# Patient Record
Sex: Male | Born: 1985 | Race: White | Hispanic: Yes | Marital: Single | Smoking: Never smoker
Health system: Southern US, Community
[De-identification: ages and names within clinical notes are randomized; demographics above are authoritative.]

## PROBLEM LIST (undated history)

## (undated) HISTORY — PX: BACK SURGERY: SHX140

---

## 2021-01-26 NOTE — Progress Notes (Deleted)
Patient WAS wearing a surgical mask  Contact precautions were followed when caring for the patient.   PPE used by provider during encounter: Surgical mask     Total time I spent in care of this patient today (excluding time spent on other billable services) was *** minutes.     NEW PATIENT VISIT       [name] is a y.o. 56yr male who presents to me for the following problems He is here for a possible male factor in a infertile marriage.  He is currently married to a *** year old male.  The couple  have been trying to achieve a pregnancy for the past *** years.      *** Neither partner has been involved in any prior pregnancies in the past.    The couple has tried ***    [name]  denies a history of testicular exposure to chemicals, radiation, or toxins.  He also denies a history of high fever, epididymitis, orchitis, prostatitis, sexually transmitted diseases, or trauma to the testicles.   He denies a history of a varicocele, testicular torsion, or cryptorchidism, and has no history of postpubertile mumps or a family history of infertility.     [name]  Does *** regular menstrual cycles.  She has no significant past medical history and does not take any medications. [name]  has been seen by Dr. Marland Kitchen , who has performed evaluation and concluded ***.      I, Kyle Ramos, personally reviewed the following labs  and interpreted results to patient  as they relate  to his problems/complaints addressed today.         PMH: ***    PSH:  ***    Meds;***    Family history reviewed:  None relevant to this visit    ***   SH:***       Physical Exam:    GENERAL: Well developed, well nourished, in no acute distress  CHEST: Normal breathing with equal chest rise.    ABDOMEN: Abdomen soft and non-tender without masses ***  BACK: No CVAT  GENITALIA: Testis descended bilaterally, testicular size *** cc bilaterally,   no varicocele palpable, vas deferens and epididymis palpable bilaterally,  Phallus: ***    EXTREMITIES: No clubbing, cyanosis, edema, or deformity    SKIN: Genital and lower body skin intact without lesions or rashes  NEURO: No focal deficits  PSYCH: Patient alert and oriented x3. 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 We will obtain a hormone panel including testosterone, FSH, LH and estradiol to determine the possible cause of male infertility. I have recommended the wife to follow-up with her GYN or reproductive endocrinologist as infertility is a couple's issue and both partners need treatment simultaneously.

## 2021-01-27 ENCOUNTER — Encounter: Payer: Self-pay | Admitting: Urology

## 2021-01-27 ENCOUNTER — Ambulatory Visit: Payer: Commercial Managed Care - PPO | Attending: Urology | Admitting: Urology

## 2021-01-27 VITALS — BP 117/75 | HR 92 | Temp 97.5°F

## 2021-01-27 DIAGNOSIS — N4601 Organic azoospermia: Secondary | ICD-10-CM | POA: Insufficient documentation

## 2021-01-27 DIAGNOSIS — I861 Scrotal varices: Secondary | ICD-10-CM | POA: Insufficient documentation

## 2021-01-27 NOTE — Progress Notes (Signed)
Patient WAS wearing a surgical mask  Contact precautions were followed when caring for the patient.   PPE used by provider during encounter: Surgical mask        NEW PATIENT VISIT       [name] is a y.o. 35yr male who presents to me for the following problems He is here for a possible male factor in a infertile marriage.  He was married to a 33 year old male.  The couple  have been trying to achieve a pregnancy for the past 1 year. They are now divorced. He may have been on clomid. No prior use of testosterone    He wants to follow up on his prior abnormal labs. He does not have a current partner, but wants to address his fertility concerns for future plans of fathering children.     [name]  denies a history of testicular exposure to chemicals, radiation, or toxins.  He also denies a history of high fever, epididymitis, orchitis, prostatitis, sexually transmitted diseases, or trauma to the testicles.   He denies a history of a varicocele, testicular torsion, or cryptorchidism, and has no history of postpubertile mumps or a family history of infertility.       Libido: normal   Energy: normal at his baseline   Reports no problems with erectile dysfunctional      I, Nicole Cella, personally reviewed the following labs  and interpreted results to patient  as they relate  to his problems/complaints addressed today.         PMH: cholelithiasis     PSH:  neonatal circumcision (he thinks it was about a month old)    Meds; none for chronic medical problems  He is on 2 months of doxycycline for folliculitis     Family history reviewed:  None relevant to this visit  SH: Chief of Staff    Physical Exam:    GENERAL: Well developed, well nourished, in no acute distress  CHEST: Normal breathing with equal chest rise.    ABDOMEN: Abdomen soft and non-tender without masses   BACK: No CVAT  GENITALIA: Testis descended bilaterally, Right testicle size 6cc, Left  testicle size 4-6cc cc, left grade I varicocele  palpable, vas deferens and epididymis palpable bilaterally, Phallus: circumcised    EXTREMITIES: No clubbing, cyanosis, edema, or deformity    SKIN: Genital and lower body skin intact without lesions or rashes  NEURO: No focal deficits  PSYCH: Patient alert and oriented x3. 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  Prev on clomid, unclear workup  Small testes, will check FSH, likely NOA  We discussed micro TESE after the work-up noted below     -varicocele: discussed unlikely significant impact on fertility.    Plan:  - FSH, LH, Estradiol, Testosteron   - genetic testing  will be ordered pending his request from his prior fertility center  -follow up  outside workup and determine if needs repeat semen analysis and genetic testing    I, Nicole Cella MD, saw and examined this patient with our resident. I reviewed the history and developed the assessment and plan and am in full agreement with this note.    Total time  spent in care of this patient today (excluding time spent on other billable services) was 45 minutes.  I discussed the different causes for male infertility and abnormal semen parameters  including genetic, environmental, varicocele and idiopathic. We will obtain a hormone panel including testosterone, FSH, LH and estradiol to determine the possible cause of male infertility.

## 2021-02-12 ENCOUNTER — Ambulatory Visit: Payer: Commercial Managed Care - PPO | Attending: Urology

## 2021-02-12 DIAGNOSIS — N4601 Organic azoospermia: Secondary | ICD-10-CM | POA: Insufficient documentation

## 2021-02-12 LAB — PROLACTIN: Prolactin: 14.8 ng/mL (ref 3.5–19.4)

## 2021-02-12 LAB — FOLLICLE STIMULATING HORMONE: Follicle Stimulating Hormone: 17.6 m[IU]/mL — ABNORMAL HIGH (ref 1.0–12.0)

## 2021-02-12 LAB — LUTEINIZING HORMONE: Luteinizing Hormone: 7 m[IU]/mL (ref 0.6–12.1)

## 2021-02-12 LAB — ESTRADIOL: Estradiol: 24.4 pg/mL

## 2021-02-16 ENCOUNTER — Encounter: Payer: Self-pay | Admitting: Urology

## 2021-02-16 NOTE — Progress Notes (Addendum)
I performed this clinical encounter by utilizing a real-time telehealth video connection between my location, which is on campus, and the patient's location, which is off campus. The patient's location was confirmed during this visit. Verbal consent was obtained from the patient to perform this clinical encounter utilizing video and any questions from the patient regarding the telehealth interaction were answered.       Total time I spent in care of this patient today (excluding time spent on other billable services) was 20  minutes.     Follow Up VISIT       [name] is a y.o. 51yr male who presents to me for follow up of  azoospermia     Logged on to review labs    I, Nicole Cella, personally reviewed the following labs  and interpreted results to patient  as they relate  to his problems/complaints addressed today.       No results found for: TESTO  No results found for: PSAS  Lab Results   Component Value Date    ES 24.40 02/12/2021     No results found for: HCT  No results found for: HGBA1C    Reviewed semen analysis results from [facility]    Component      Latest Ref Rng & Units 02/12/2021 02/12/2021 02/12/2021 02/12/2021           9:53 AM  9:53 AM  9:53 AM  9:53 AM   Estradiol      See Comment pg/mL    24.40   Follicle Stimulating Hormone      1.0 - 12.0 mIU/mL   17.6 (H)    Luteinizing Hormone      0.6 - 12.1 mIU/mL  7.0     Prolactin      3.5 - 19.4 ng/mL 14.8          PMH: cholelithiasis      PSH:  neonatal circumcision (he thinks it was about a month old)     Assessment:   -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  Prev on clomid, unclear workup  Small testes, FSH 17-->NOA  We discussed micro TESE after the work-up noted below      I discussed micro TESE (discussed cost of $6000) in detail in cluding risks of procedure: bleeding, infection, damage or loss of testicle, testicular atrophy, lower testosterone, inability to find  sperm or enough sperm for freezing.    -varicocele: discussed unlikely significant impact on fertility.     Plan:  Orders Placed This Encounter    Lab Miscellaneous Procedure SendOut    Chromosome Analysis,Periph Bld SendOut    -follow up testosterone   -consider future semen analysis in 3-6 months-->will mail patient order  -reach  out to patient about labs above

## 2021-02-16 NOTE — Telephone Encounter (Signed)
Labs done on 02/12/21

## 2021-02-16 NOTE — Progress Notes (Unsigned)
Received results from [facility]:

## 2021-02-16 NOTE — Telephone Encounter (Signed)
From: Bethann Punches  To: Lacretia Nicks, MD  Sent: 02/10/2021 4:03 PM PDT  Subject: Analysis    Hello Dr. Yolande Jolly,  I have just finish the semen analysis but I didn't got any blood withdraws to check my chromosome and hormone level. Can we also set this up please?  Thanks,  Jemell Lily Kocher

## 2021-02-17 ENCOUNTER — Encounter: Payer: Self-pay | Admitting: Urology

## 2021-02-17 ENCOUNTER — Ambulatory Visit: Payer: Commercial Managed Care - PPO | Admitting: Urology

## 2021-02-17 DIAGNOSIS — N4601 Organic azoospermia: Secondary | ICD-10-CM

## 2021-02-17 DIAGNOSIS — I861 Scrotal varices: Secondary | ICD-10-CM

## 2021-02-17 NOTE — Progress Notes (Signed)
Per patient request, an order for semen analysis was mailed out to him today.     Lily Kocher, LVN

## 2021-02-18 LAB — TESTOSTERONE, TOTAL & FREE, SERUM SENDOUT

## 2021-02-19 ENCOUNTER — Encounter: Payer: Self-pay | Admitting: Urology

## 2021-02-19 ENCOUNTER — Ambulatory Visit: Payer: Commercial Managed Care - PPO | Attending: Urology

## 2021-02-19 DIAGNOSIS — N4601 Organic azoospermia: Secondary | ICD-10-CM

## 2021-03-01 LAB — CHROMOSOME ANALYSIS,PERIPH BLD SENDOUT

## 2021-03-01 LAB — LAB MISCELLANEOUS PROCEDURE SENDOUT

## 2021-05-26 ENCOUNTER — Ambulatory Visit: Payer: Commercial Managed Care - PPO | Admitting: Urology

## 2021-10-16 ENCOUNTER — Encounter (HOSPITAL_COMMUNITY): Payer: Self-pay | Admitting: Emergency Medicine

## 2021-10-16 ENCOUNTER — Observation Stay (HOSPITAL_COMMUNITY)
Admission: EM | Admit: 2021-10-16 | Discharge: 2021-10-17 | Disposition: A | Discharge status: Home / Self Care | Payer: Self-pay | Attending: General Surgery | Admitting: General Surgery

## 2021-10-16 ENCOUNTER — Emergency Department (HOSPITAL_COMMUNITY): Payer: Self-pay

## 2021-10-16 ENCOUNTER — Other Ambulatory Visit: Payer: Self-pay

## 2021-10-16 DIAGNOSIS — K8064 Calculus of gallbladder and bile duct with chronic cholecystitis without obstruction: Principal | ICD-10-CM | POA: Insufficient documentation

## 2021-10-16 DIAGNOSIS — K8050 Calculus of bile duct without cholangitis or cholecystitis without obstruction: Secondary | ICD-10-CM

## 2021-10-16 DIAGNOSIS — R1011 Right upper quadrant pain: Secondary | ICD-10-CM

## 2021-10-16 HISTORY — DX: Calculus of bile duct without cholangitis or cholecystitis without obstruction: K80.50

## 2021-10-16 LAB — COMPREHENSIVE METABOLIC PANEL
ALT: 581 U/L — ABNORMAL HIGH (ref 0–44)
AST: 424 U/L — ABNORMAL HIGH (ref 15–41)
Albumin: 4.4 g/dL (ref 3.5–5.0)
Alkaline Phosphatase: 169 U/L — ABNORMAL HIGH (ref 38–126)
Anion gap: 8 (ref 5–15)
BUN: 9 mg/dL (ref 6–20)
CO2: 25 mmol/L (ref 22–32)
Calcium: 9.5 mg/dL (ref 8.9–10.3)
Chloride: 103 mmol/L (ref 98–111)
Creatinine, Ser: 0.9 mg/dL (ref 0.61–1.24)
GFR, Estimated: >60 mL/min (ref >60)
Glucose, Bld: 104 mg/dL — ABNORMAL HIGH (ref 70–99)
Potassium: 4 mmol/L (ref 3.5–5.1)
Sodium: 136 mmol/L (ref 135–145)
Total Bilirubin: 3.7 mg/dL — ABNORMAL HIGH (ref 0.3–1.2)
Total Protein: 7.1 g/dL (ref 6.5–8.1)

## 2021-10-16 LAB — CBC
HCT: 43.4 % (ref 39.0–52.0)
Hemoglobin: 15 g/dL (ref 13.0–17.0)
MCH: 31.6 pg (ref 26.0–34.0)
MCHC: 34.6 g/dL (ref 30.0–36.0)
MCV: 91.4 fL (ref 80.0–100.0)
Platelets: 210 10*3/uL (ref 150–400)
RBC: 4.75 MIL/uL (ref 4.22–5.81)
RDW: 13.2 % (ref 11.5–15.5)
WBC: 6.7 10*3/uL (ref 4.0–10.5)
nRBC: 0 % (ref 0.0–0.2)

## 2021-10-16 LAB — LIPASE, BLOOD: Lipase: 61 U/L — ABNORMAL HIGH (ref 11–51)

## 2021-10-16 MED ORDER — SODIUM CHLORIDE 0.9 % IV SOLN
2.0000 g | INTRAVENOUS | Status: DC
  Administered 2021-10-16: 2 g via INTRAVENOUS
  Filled 2021-10-16: qty 20

## 2021-10-16 MED ORDER — ONDANSETRON HCL 4 MG/2ML IJ SOLN: 4.0000 mg | Freq: Four times a day (QID) | INTRAMUSCULAR | Status: DC | PRN

## 2021-10-16 MED ORDER — MORPHINE SULFATE (PF) 4 MG/ML IV SOLN
4.0000 mg | Freq: Once | INTRAVENOUS | Status: DC
  Filled 2021-10-16: qty 1

## 2021-10-16 MED ORDER — SODIUM CHLORIDE 0.9 % IV BOLUS
1000.0000 mL | Freq: Once | INTRAVENOUS | Status: AC
  Administered 2021-10-16: 1000 mL via INTRAVENOUS

## 2021-10-16 MED ORDER — MORPHINE SULFATE (PF) 2 MG/ML IV SOLN: 2.0000 mg | INTRAVENOUS | Status: DC | PRN

## 2021-10-16 MED ORDER — ACETAMINOPHEN 500 MG PO TABS
1000.0000 mg | ORAL_TABLET | Freq: Four times a day (QID) | ORAL | Status: DC
  Administered 2021-10-16: 1000 mg via ORAL
  Filled 2021-10-16: qty 2

## 2021-10-16 MED ORDER — KETOROLAC TROMETHAMINE 15 MG/ML IJ SOLN
30.0000 mg | Freq: Four times a day (QID) | INTRAMUSCULAR | Status: DC
  Administered 2021-10-16: 30 mg via INTRAVENOUS
  Filled 2021-10-16: qty 2

## 2021-10-16 MED ORDER — ONDANSETRON HCL 4 MG/2ML IJ SOLN
4.0000 mg | Freq: Once | INTRAMUSCULAR | Status: AC
  Administered 2021-10-16: 4 mg via INTRAVENOUS
  Filled 2021-10-16: qty 2

## 2021-10-16 MED ORDER — METHOCARBAMOL 1000 MG/10ML IJ SOLN
1000.0000 mg | Freq: Three times a day (TID) | INTRAVENOUS | Status: DC
  Filled 2021-10-16: qty 10

## 2021-10-16 MED ORDER — LACTATED RINGERS IV SOLN: INTRAVENOUS | Status: DC

## 2021-10-16 MED ORDER — METHOCARBAMOL 500 MG PO TABS: 1000.0000 mg | ORAL_TABLET | Freq: Three times a day (TID) | ORAL | Status: DC

## 2021-10-16 MED ORDER — ENOXAPARIN SODIUM 40 MG/0.4ML IJ SOSY
40.0000 mg | PREFILLED_SYRINGE | INTRAMUSCULAR | Status: DC
  Administered 2021-10-16: 40 mg via SUBCUTANEOUS
  Filled 2021-10-16: qty 0.4

## 2021-10-16 MED ORDER — ONDANSETRON 4 MG PO TBDP: 4.0000 mg | ORAL_TABLET | Freq: Four times a day (QID) | ORAL | Status: DC | PRN

## 2021-10-16 MED ORDER — OXYCODONE HCL 5 MG/5ML PO SOLN
5.0000 mg | ORAL | Status: DC | PRN
  Filled 2021-10-16: qty 10

## 2021-10-16 MED ORDER — DOCUSATE SODIUM 100 MG PO CAPS: 100.0000 mg | ORAL_CAPSULE | Freq: Two times a day (BID) | ORAL | Status: DC

## 2021-10-16 MED ORDER — MORPHINE SULFATE (PF) 4 MG/ML IV SOLN
4.0000 mg | Freq: Once | INTRAVENOUS | Status: AC
  Administered 2021-10-16: 4 mg via INTRAVENOUS
  Filled 2021-10-16: qty 1

## 2021-10-16 NOTE — H&P (Signed)
Reason for Consult/Chief Complaint: symptomatic cholelithiasis Consultant: Blanchie Dessert, [place]  Jimmy Rivera is an 36 y.o. male.   HPI: 81M with epigastric and LUQ abdominal pain for the last three days. Associated nausea, vomiting, anorexia, and scleral icterus. Has not taken anything for the pain.  Known h/o gallstones dx 2y ago and reports 2-3 episodes per year. States this is not the most severe episode, but is the longest lasting episode. Last BM 1d ago.   History reviewed. No pertinent past medical history.  History reviewed. No pertinent surgical history.  History reviewed. No pertinent family history.  Social History:  has no history on file for tobacco use, alcohol use, and drug use.   Allergies: Not on File  Medications: I have reviewed the patient's current medications.  Results for orders placed or performed during the hospital encounter of 10/16/21 (from the past 48 hour(s))  Lipase, blood     Status: Abnormal   Collection Time: 10/16/21  4:20 PM  Result Value Ref Range   Lipase 61 (H) 11 - 51 U/L    Comment: Performed at [facility], [address], [place], [place] [address]  Comprehensive metabolic panel     Status: Abnormal   Collection Time: 10/16/21  4:20 PM  Result Value Ref Range   Sodium 136 135 - 145 mmol/L   Potassium 4.0 3.5 - 5.1 mmol/L   Chloride 103 98 - 111 mmol/L   CO2 25 22 - 32 mmol/L   Glucose, Bld 104 (H) 70 - 99 mg/dL    Comment: Glucose reference range applies only to samples taken after fasting for at least 8 hours.   BUN 9 6 - 20 mg/dL   Creatinine, Ser 6.29 0.61 - 1.24 mg/dL   Calcium 9.5 8.9 - 47.6 mg/dL   Total Protein 7.1 6.5 - 8.1 g/dL   Albumin 4.4 3.5 - 5.0 g/dL   AST 546 (H) 15 - 41 U/L   ALT 581 (H) 0 - 44 U/L   Alkaline Phosphatase 169 (H) 38 - 126 U/L   Total Bilirubin 3.7 (H) 0.3 - 1.2 mg/dL   GFR, Estimated >50 >35 mL/min    Comment: (NOTE) Calculated using the CKD-EPI Creatinine Equation (2021)    Anion gap 8  5 - 15    Comment: Performed at [facility], [address] [place], [place] [address]  CBC     Status: None   Collection Time: 10/16/21  4:20 PM  Result Value Ref Range   WBC 6.7 4.0 - 10.5 K/uL   RBC 4.75 4.22 - 5.81 MIL/uL   Hemoglobin 15.0 13.0 - 17.0 g/dL   HCT 12.7 51.7 - 00.1 %   MCV 91.4 80.0 - 100.0 fL   MCH 31.6 26.0 - 34.0 pg   MCHC 34.6 30.0 - 36.0 g/dL   RDW 74.9 44.9 - 67.5 %   Platelets 210 150 - 400 K/uL   nRBC 0.0 0.0 - 0.2 %    Comment: Performed at [facility], [address], [place], [place] [address]    US Abdomen Limited RUQ (LIVER/GB)  Result Date: 10/16/2021 CLINICAL DATA:  Right upper quadrant abdominal pain x2 days EXAM: ULTRASOUND ABDOMEN LIMITED RIGHT UPPER QUADRANT COMPARISON:  None Available. FINDINGS: Gallbladder: Multiple gallbladder stones are seen. There is wall thickening in gallbladder measuring 6 mm. Trace amount of fluid is noted adjacent to the gallbladder. Technologist did not observe any tenderness over the gallbladder during the study.  Common bile duct: Diameter: 7.7 mm.  Distal common bile duct is obscured by bowel gas. Liver: No focal lesion identified. Within normal limits in parenchymal echogenicity. Portal vein is patent on color Doppler imaging with normal direction of blood flow towards the liver. Other: None. IMPRESSION: Gallbladder stones. Gallbladder is not distended. There is wall thickening in gallbladder along with trace amount of pericholecystic fluid. Findings may suggest acute or chronic cholecystitis. Please correlate with clinical symptoms and laboratory findings. Technologist did not observe any focal tenderness over the gallbladder during the study. There is prominence of proximal common bile duct measuring 7.7 mm without intrahepatic bile duct dilation. Electronically Signed   By: Elmer Picker M.D.   On: 10/16/2021 17:56    ROS 10 point review of systems is negative except as listed above in HPI.   Physical  Exam Blood pressure 107/75, pulse 65, temperature 98.3 F (36.8 C), resp. rate 16, SpO2 99 %. Constitutional: well-developed, well-nourished HEENT: pupils equal, round, reactive to light, 89mm b/l, moist conjunctiva, external inspection of ears and nose normal, hearing intact Oropharynx: normal oropharyngeal mucosa, normal dentition Neck: no thyromegaly, trachea midline, no midline cervical tenderness to palpation Chest: breath sounds equal bilaterally, normal respiratory effort, no midline or lateral chest wall tenderness to palpation/deformity Abdomen: soft, NT, no bruising, no hepatosplenomegaly GU: no blood at urethral meatus of penis, no scrotal masses or abnormality  Back: no wounds, no thoracic/lumbar spine tenderness to palpation, no thoracic/lumbar spine stepoffs Rectal: deferred Extremities: 2+ radial and pedal pulses bilaterally, intact motor and sensation bilateral UE and LE, no peripheral edema MSK: unable to assess gait/station, no clubbing/cyanosis of fingers/toes, normal ROM of all four extremities Skin: warm, dry, no rashes Psych: normal memory, normal mood/affect     Assessment/Plan: 77M with symptomatic cholelithiasis and probable choledocholithiasis. Recommend lap chole and IOC given elevated bilirubin. Informed consent was obtained after detailed explanation of risks, including bleeding, infection, biloma, hematoma, injury to common bile duct, and need for conversion to open procedure. All questions answered to the patient's satisfaction.   Jesusita Oka, MD General and Trauma Surgery [facility]

## 2021-10-16 NOTE — ED Provider Notes (Signed)
[facility] EMERGENCY DEPARTMENT Provider Note   CSN: [id number] Arrival date & time: 10/16/21  1545     History  Chief Complaint  Patient presents with   Abdominal Pain    Jimmy Rivera is a 36 y.o. male.   Abdominal Pain Patient is a 36 year old male with no pertinent past medical history presented emergency room today with complaints of right upper quadrant abdominal pain.  He does mention that he was told he had gallstones several years ago.  He states that over the past few years he has a few episodes a year of right upper quadrant abdominal pain.  He states that Thursday night 5/25 he felt some discomfort which persisted through today.  He states it is gradually worsened.  He states that it does seem to radiate to his right shoulder blade.  He states that normally when he has episodes of pain it lasts for only an hour or 2 he states that this is most severe and most persistent pain he has had.  He endorses some nausea without vomiting.  No fevers.  He states he feels like his skin tone is not changed.  No urinary frequency urgency dysuria hematuria.  No significant alcohol intake.  No recreational drug use.  He is not on any blood thinners.  No other associate symptoms.    Home Medications Prior to Admission medications   Not on File      Allergies    Lactose intolerance (gi)    Review of Systems   Review of Systems  Gastrointestinal:  Positive for abdominal pain.   Physical Exam Updated Vital Signs BP 107/75   Pulse 65   Temp 98.3 F (36.8 C)   Resp 16   SpO2 99%  Physical Exam Vitals and nursing note reviewed.  Constitutional:      General: He is not in acute distress.    Comments: Uncomfortable 36 year old male in no acute distress.  Nontoxic-appearing.  HENT:     Head: Normocephalic and atraumatic.     Nose: Nose normal.  Eyes:     General: No scleral icterus. Cardiovascular:     Rate and Rhythm: Normal rate and regular rhythm.      Pulses: Normal pulses.     Heart sounds: Normal heart sounds.  Pulmonary:     Effort: Pulmonary effort is normal. No respiratory distress.     Breath sounds: No wheezing.  Abdominal:     Palpations: Abdomen is soft.     Tenderness: There is abdominal tenderness in the right upper quadrant and epigastric area. There is no guarding.  Musculoskeletal:     Cervical back: Normal range of motion.     Right lower leg: No edema.     Left lower leg: No edema.  Skin:    General: Skin is warm and dry.     Capillary Refill: Capillary refill takes less than 2 seconds.  Neurological:     Mental Status: He is alert. Mental status is at baseline.  Psychiatric:        Mood and Affect: Mood normal.        Behavior: Behavior normal.    ED Results / Procedures / Treatments   Labs (all labs ordered are listed, but only abnormal results are displayed) Labs Reviewed  LIPASE, BLOOD - Abnormal; Notable for the following components:      Result Value   Lipase 61 (*)    All other components within normal limits  COMPREHENSIVE METABOLIC  PANEL - Abnormal; Notable for the following components:   Glucose, Bld 104 (*)    AST 424 (*)    ALT 581 (*)    Alkaline Phosphatase 169 (*)    Total Bilirubin 3.7 (*)    All other components within normal limits  CBC  URINALYSIS, ROUTINE W REFLEX MICROSCOPIC  HIV ANTIBODY (ROUTINE TESTING W REFLEX)  COMPREHENSIVE METABOLIC PANEL  CBC    EKG None  Radiology US Abdomen Limited RUQ (LIVER/GB)  Result Date: 10/16/2021 CLINICAL DATA:  Right upper quadrant abdominal pain x2 days EXAM: ULTRASOUND ABDOMEN LIMITED RIGHT UPPER QUADRANT COMPARISON:  None Available. FINDINGS: Gallbladder: Multiple gallbladder stones are seen. There is wall thickening in gallbladder measuring 6 mm. Trace amount of fluid is noted adjacent to the gallbladder. Technologist did not observe any tenderness over the gallbladder during the study. Common bile duct: Diameter: 7.7 mm.  Distal common  bile duct is obscured by bowel gas. Liver: No focal lesion identified. Within normal limits in parenchymal echogenicity. Portal vein is patent on color Doppler imaging with normal direction of blood flow towards the liver. Other: None. IMPRESSION: Gallbladder stones. Gallbladder is not distended. There is wall thickening in gallbladder along with trace amount of pericholecystic fluid. Findings may suggest acute or chronic cholecystitis. Please correlate with clinical symptoms and laboratory findings. Technologist did not observe any focal tenderness over the gallbladder during the study. There is prominence of proximal common bile duct measuring 7.7 mm without intrahepatic bile duct dilation. Electronically Signed   By: Elmer Picker M.D.   On: 10/16/2021 17:56    Procedures Procedures    Medications Ordered in ED Medications  morphine (PF) 4 MG/ML injection 4 mg (4 mg Intravenous Patient Refused/Not Given 10/16/21 1628)  enoxaparin (LOVENOX) injection 40 mg (has no administration in time range)  lactated ringers infusion (has no administration in time range)  cefTRIAXone (ROCEPHIN) 2 g in sodium chloride 0.9 % 100 mL IVPB (has no administration in time range)  acetaminophen (TYLENOL) tablet 1,000 mg (has no administration in time range)  methocarbamol (ROBAXIN) tablet 1,000 mg (has no administration in time range)    Or  methocarbamol (ROBAXIN) 1,000 mg in dextrose 5 % 100 mL IVPB (has no administration in time range)  docusate sodium (COLACE) capsule 100 mg (has no administration in time range)  ondansetron (ZOFRAN-ODT) disintegrating tablet 4 mg (has no administration in time range)    Or  ondansetron (ZOFRAN) injection 4 mg (has no administration in time range)  oxyCODONE (ROXICODONE) 5 MG/5ML solution 5-10 mg (has no administration in time range)  morphine (PF) 2 MG/ML injection 2-4 mg (has no administration in time range)  ketorolac (TORADOL) 15 MG/ML injection 30 mg (has no  administration in time range)  ondansetron (ZOFRAN) injection 4 mg (4 mg Intravenous Given 10/16/21 1627)  sodium chloride 0.9 % bolus 1,000 mL (1,000 mLs Intravenous New Bag/Given 10/16/21 1627)  morphine (PF) 4 MG/ML injection 4 mg (4 mg Intravenous Given 10/16/21 1743)    ED Course/ Medical Decision Making/ A&P Clinical Course as of 10/16/21 1952  Sat Oct 16, 2021  1800 IMPRESSION: Gallbladder stones. Gallbladder is not distended. There is wall thickening in gallbladder along with trace amount of pericholecystic fluid. Findings may suggest acute or chronic cholecystitis. Please correlate with clinical symptoms and laboratory findings. Technologist did not observe any focal tenderness over the gallbladder during the study.   There is prominence of proximal common bile duct measuring 7.7 mm without intrahepatic bile duct dilation.   [  WF]    Clinical Course User Index [WF] Tedd Sias, [place]                           Medical Decision Making Amount and/or Complexity of Data Reviewed Labs: ordered. Radiology: ordered.  Risk Prescription drug management. Decision regarding hospitalization.   This patient presents to the ED for concern of abd pain, this involves a number of treatment options, and is a complaint that carries with it a high risk of complications and morbidity. The causes of generalized abdominal pain include but are not limited to AAA, mesenteric ischemia, appendicitis, diverticulitis, DKA, gastritis, gastroenteritis, AMI, nephrolithiasis, pancreatitis, peritonitis, adrenal insufficiency,lead poisoning, iron toxicity, intestinal ischemia, constipation, UTI,SBO/LBO, splenic rupture, biliary disease, IBD, IBS, PUD, or hepatitis.   Co morbidities: Discussed in HPI   Brief History:  Patient is a 36 year old male with no pertinent past medical history presented emergency room today with complaints of right upper quadrant abdominal pain.  He does mention that he was  told he had gallstones several years ago.  He states that over the past few years he has a few episodes a year of right upper quadrant abdominal pain.  He states that Thursday night 5/25 he felt some discomfort which persisted through today.  He states it is gradually worsened.  He states that it does seem to radiate to his right shoulder blade.  He states that normally when he has episodes of pain it lasts for only an hour or 2 he states that this is most severe and most persistent pain he has had.  He endorses some nausea without vomiting.  No fevers.  He states he feels like his skin tone is not changed.  No urinary frequency urgency dysuria hematuria.  No significant alcohol intake.  No recreational drug use.  He is not on any blood thinners.  No other associate symptoms.   Right upper quadrant abdominal tenderness on exam.  Will obtain right upper quadrant ultrasound, abdominal labs, gave morphine, Zofran and fluids.  Will reassess.   EMR reviewed including pt PMHx, past surgical history and past visits to ER.   See HPI for more details   Lab Tests:   I ordered and independently interpreted labs. Labs notable for transaminitis, mild elevation of lipase and elevated bilirubin at 3.7 consistent with choledocholithiasis.  CBC without leukocytosis or anemia.   Imaging Studies:  Abnormal findings. I personally reviewed all imaging studies. Imaging notable for Biliary stones and common bile duct dilatation consistent again with choledocholithiasis.   Cardiac Monitoring:  NA NA   Medicines ordered:  I ordered medication including morphine, Zofran, 1 L normal saline for pain nausea and dehydration Reevaluation of the patient after these medicines showed that the patient improved I have reviewed the patients home medicines and have made adjustments as needed   Critical Interventions:     Consults/Attending Physician   I discussed this case with my attending physician who  cosigned this note including patient's presenting symptoms, physical exam, and planned diagnostics and interventions. Attending physician stated agreement with plan or made changes to plan which were implemented.  Discussed with Dr. Bobbye Morton of general surgery who would admit and plan for cholecystectomy tomorrow.   Reevaluation:  After the interventions noted above I re-evaluated patient and found that they have :improved   Social Determinants of Health:      Problem List / ED Course:  Choledocholithiasis Transaminitis secondary to above.  Pain control  at this time.  General surgery admitting   Dispostion:  After consideration of the diagnostic results and the patients response to treatment, I feel that the patent would benefit from admission.    Final Clinical Impression(s) / ED Diagnoses Final diagnoses:  Right upper quadrant abdominal pain  Choledocholithiasis    Rx / DC Orders ED Discharge Orders     None         Tedd Sias, [place] 10/16/21 1952    Hayden Rasmussen, MD 10/17/21 1032

## 2021-10-16 NOTE — ED Triage Notes (Signed)
Pt arrives c/o what he believes is a gallbladder attack, hx of gallstones a few years ago, states the pain feels the same. Endorses pain to upper abdomen, 8/10 pain. Pain has been ongoing x2 days. Endorses mild nausea, denies fevers, vomiting.

## 2021-10-17 ENCOUNTER — Observation Stay (HOSPITAL_BASED_OUTPATIENT_CLINIC_OR_DEPARTMENT_OTHER): Payer: Self-pay | Admitting: Certified Registered"

## 2021-10-17 ENCOUNTER — Observation Stay (HOSPITAL_COMMUNITY): Payer: Self-pay | Admitting: Certified Registered"

## 2021-10-17 ENCOUNTER — Ambulatory Visit: Admit: 2021-10-17 | Payer: Self-pay | Admitting: General Surgery

## 2021-10-17 ENCOUNTER — Encounter (HOSPITAL_COMMUNITY)
Admission: EM | Disposition: A | Discharge status: Home / Self Care | Payer: Self-pay | Source: Home / Self Care | Attending: Emergency Medicine

## 2021-10-17 ENCOUNTER — Other Ambulatory Visit: Payer: Self-pay

## 2021-10-17 ENCOUNTER — Encounter (HOSPITAL_COMMUNITY): Payer: Self-pay

## 2021-10-17 DIAGNOSIS — K8010 Calculus of gallbladder with chronic cholecystitis without obstruction: Secondary | ICD-10-CM

## 2021-10-17 HISTORY — PX: CHOLECYSTECTOMY: SHX55

## 2021-10-17 LAB — CBC
HCT: 35.6 % — ABNORMAL LOW (ref 39.0–52.0)
Hemoglobin: 12.6 g/dL — ABNORMAL LOW (ref 13.0–17.0)
MCH: 32.1 pg (ref 26.0–34.0)
MCHC: 35.4 g/dL (ref 30.0–36.0)
MCV: 90.8 fL (ref 80.0–100.0)
Platelets: 167 10*3/uL (ref 150–400)
RBC: 3.92 MIL/uL — ABNORMAL LOW (ref 4.22–5.81)
RDW: 13.1 % (ref 11.5–15.5)
WBC: 5.3 10*3/uL (ref 4.0–10.5)
nRBC: 0 % (ref 0.0–0.2)

## 2021-10-17 LAB — COMPREHENSIVE METABOLIC PANEL
ALT: 395 U/L — ABNORMAL HIGH (ref 0–44)
AST: 232 U/L — ABNORMAL HIGH (ref 15–41)
Albumin: 3.3 g/dL — ABNORMAL LOW (ref 3.5–5.0)
Alkaline Phosphatase: 141 U/L — ABNORMAL HIGH (ref 38–126)
Anion gap: 6 (ref 5–15)
BUN: 8 mg/dL (ref 6–20)
CO2: 26 mmol/L (ref 22–32)
Calcium: 8.6 mg/dL — ABNORMAL LOW (ref 8.9–10.3)
Chloride: 105 mmol/L (ref 98–111)
Creatinine, Ser: 0.83 mg/dL (ref 0.61–1.24)
GFR, Estimated: >60 mL/min (ref >60)
Glucose, Bld: 91 mg/dL (ref 70–99)
Potassium: 3.6 mmol/L (ref 3.5–5.1)
Sodium: 137 mmol/L (ref 135–145)
Total Bilirubin: 1 mg/dL (ref 0.3–1.2)
Total Protein: 5.4 g/dL — ABNORMAL LOW (ref 6.5–8.1)

## 2021-10-17 LAB — URINALYSIS, ROUTINE W REFLEX MICROSCOPIC
Bilirubin Urine: NEGATIVE
Glucose, UA: NEGATIVE mg/dL
Hgb urine dipstick: NEGATIVE
Ketones, ur: 20 mg/dL — AB
Leukocytes,Ua: NEGATIVE
Nitrite: NEGATIVE
Protein, ur: NEGATIVE mg/dL
Specific Gravity, Urine: 1.024 (ref 1.005–1.030)
pH: 5 (ref 5.0–8.0)

## 2021-10-17 LAB — HIV ANTIBODY (ROUTINE TESTING W REFLEX): HIV Screen 4th Generation wRfx: NONREACTIVE

## 2021-10-17 LAB — SURGICAL PCR SCREEN
MRSA, PCR: NEGATIVE
Staphylococcus aureus: NEGATIVE

## 2021-10-17 SURGERY — LAPAROSCOPIC CHOLECYSTECTOMY
Anesthesia: General | Site: Abdomen

## 2021-10-17 MED ORDER — BUPIVACAINE-EPINEPHRINE (PF) 0.25% -1:200000 IJ SOLN
INTRAMUSCULAR | Status: AC
  Filled 2021-10-17: qty 60

## 2021-10-17 MED ORDER — ACETAMINOPHEN 500 MG PO TABS
ORAL_TABLET | ORAL | Status: AC
  Administered 2021-10-17: 1000 mg via ORAL
  Filled 2021-10-17: qty 2

## 2021-10-17 MED ORDER — DEXAMETHASONE SODIUM PHOSPHATE 10 MG/ML IJ SOLN
INTRAMUSCULAR | Status: DC | PRN
  Administered 2021-10-17: 10 mg via INTRAVENOUS

## 2021-10-17 MED ORDER — ONDANSETRON HCL 4 MG/2ML IJ SOLN: 4.0000 mg | Freq: Once | INTRAMUSCULAR | Status: DC | PRN

## 2021-10-17 MED ORDER — PROPOFOL 10 MG/ML IV BOLUS
INTRAVENOUS | Status: AC
  Filled 2021-10-17: qty 20

## 2021-10-17 MED ORDER — MIDAZOLAM HCL 2 MG/2ML IJ SOLN
INTRAMUSCULAR | Status: DC | PRN
  Administered 2021-10-17: 2 mg via INTRAVENOUS

## 2021-10-17 MED ORDER — ACETAMINOPHEN 500 MG PO TABS: 1000.0000 mg | ORAL_TABLET | Freq: Once | ORAL | Status: AC

## 2021-10-17 MED ORDER — MIDAZOLAM HCL 2 MG/2ML IJ SOLN
INTRAMUSCULAR | Status: AC
  Filled 2021-10-17: qty 2

## 2021-10-17 MED ORDER — SODIUM CHLORIDE 0.9 % IR SOLN
Status: DC | PRN
  Administered 2021-10-17: 1000 mL

## 2021-10-17 MED ORDER — LACTATED RINGERS IV SOLN: INTRAVENOUS | Status: DC

## 2021-10-17 MED ORDER — CHLORHEXIDINE GLUCONATE 0.12 % MT SOLN: 15.0000 mL | Freq: Once | OROMUCOSAL | Status: AC

## 2021-10-17 MED ORDER — PHENYLEPHRINE 80 MCG/ML (10ML) SYRINGE FOR IV PUSH (FOR BLOOD PRESSURE SUPPORT)
PREFILLED_SYRINGE | INTRAVENOUS | Status: AC
  Filled 2021-10-17: qty 30

## 2021-10-17 MED ORDER — PROPOFOL 10 MG/ML IV BOLUS
INTRAVENOUS | Status: DC | PRN
Start: 2021-10-17 — End: 2021-10-17
  Administered 2021-10-17: 180 mg via INTRAVENOUS

## 2021-10-17 MED ORDER — FENTANYL CITRATE (PF) 100 MCG/2ML IJ SOLN: 25.0000 ug | INTRAMUSCULAR | Status: DC | PRN

## 2021-10-17 MED ORDER — DEXAMETHASONE SODIUM PHOSPHATE 10 MG/ML IJ SOLN
INTRAMUSCULAR | Status: AC
  Filled 2021-10-17: qty 3

## 2021-10-17 MED ORDER — 0.9 % SODIUM CHLORIDE (POUR BTL) OPTIME
TOPICAL | Status: DC | PRN
  Administered 2021-10-17: 1000 mL

## 2021-10-17 MED ORDER — OXYCODONE-ACETAMINOPHEN 5-325 MG PO TABS: 1.0000 | ORAL_TABLET | ORAL | 0 refills | Status: AC | PRN

## 2021-10-17 MED ORDER — FENTANYL CITRATE (PF) 250 MCG/5ML IJ SOLN
INTRAMUSCULAR | Status: DC | PRN
  Administered 2021-10-17 (×2): 50 ug via INTRAVENOUS
  Administered 2021-10-17: 100 ug via INTRAVENOUS
  Administered 2021-10-17: 50 ug via INTRAVENOUS

## 2021-10-17 MED ORDER — ROCURONIUM BROMIDE 10 MG/ML (PF) SYRINGE
PREFILLED_SYRINGE | INTRAVENOUS | Status: DC | PRN
  Administered 2021-10-17: 20 mg via INTRAVENOUS
  Administered 2021-10-17: 60 mg via INTRAVENOUS

## 2021-10-17 MED ORDER — BUPIVACAINE-EPINEPHRINE (PF) 0.25% -1:200000 IJ SOLN
INTRAMUSCULAR | Status: DC | PRN
  Administered 2021-10-17: 13 mL

## 2021-10-17 MED ORDER — CHLORHEXIDINE GLUCONATE 0.12 % MT SOLN
OROMUCOSAL | Status: AC
Start: 2021-10-17 — End: 2021-10-17
  Administered 2021-10-17: 15 mL via OROMUCOSAL
  Filled 2021-10-17: qty 15

## 2021-10-17 MED ORDER — FENTANYL CITRATE (PF) 250 MCG/5ML IJ SOLN
INTRAMUSCULAR | Status: AC
  Filled 2021-10-17: qty 5

## 2021-10-17 MED ORDER — LIDOCAINE 2% (20 MG/ML) 5 ML SYRINGE
INTRAMUSCULAR | Status: AC
  Filled 2021-10-17: qty 20

## 2021-10-17 MED ORDER — PHENYLEPHRINE 80 MCG/ML (10ML) SYRINGE FOR IV PUSH (FOR BLOOD PRESSURE SUPPORT)
PREFILLED_SYRINGE | INTRAVENOUS | Status: DC | PRN
  Administered 2021-10-17: 120 ug via INTRAVENOUS

## 2021-10-17 MED ORDER — ONDANSETRON HCL 4 MG/2ML IJ SOLN
INTRAMUSCULAR | Status: DC | PRN
Start: 2021-10-17 — End: 2021-10-17
  Administered 2021-10-17: 4 mg via INTRAVENOUS

## 2021-10-17 MED ORDER — ROCURONIUM BROMIDE 10 MG/ML (PF) SYRINGE
PREFILLED_SYRINGE | INTRAVENOUS | Status: AC
  Filled 2021-10-17: qty 40

## 2021-10-17 MED ORDER — CEFAZOLIN SODIUM 1 G IJ SOLR
INTRAMUSCULAR | Status: AC
  Filled 2021-10-17: qty 20

## 2021-10-17 MED ORDER — ORAL CARE MOUTH RINSE: 15.0000 mL | Freq: Once | OROMUCOSAL | Status: AC

## 2021-10-17 MED ORDER — ONDANSETRON HCL 4 MG/2ML IJ SOLN
INTRAMUSCULAR | Status: AC
  Filled 2021-10-17: qty 8

## 2021-10-17 MED ORDER — LIDOCAINE 2% (20 MG/ML) 5 ML SYRINGE
INTRAMUSCULAR | Status: DC | PRN
  Administered 2021-10-17: 80 mg via INTRAVENOUS

## 2021-10-17 MED ORDER — SUGAMMADEX SODIUM 200 MG/2ML IV SOLN
INTRAVENOUS | Status: DC | PRN
  Administered 2021-10-17: 200 mg via INTRAVENOUS

## 2021-10-17 MED ORDER — SUCCINYLCHOLINE CHLORIDE 200 MG/10ML IV SOSY
PREFILLED_SYRINGE | INTRAVENOUS | Status: AC
  Filled 2021-10-17: qty 10

## 2021-10-17 SURGICAL SUPPLY — 35 items
CANISTER SUCT 3000ML PPV (MISCELLANEOUS) ×3 IMPLANT
CHLORAPREP W/TINT 26 (MISCELLANEOUS) ×3 IMPLANT
CLIP LIGATING HEMO O LOK GREEN (MISCELLANEOUS) ×5 IMPLANT
COVER SURGICAL LIGHT HANDLE (MISCELLANEOUS) ×3 IMPLANT
DERMABOND ADVANCED (GAUZE/BANDAGES/DRESSINGS) ×1
DERMABOND ADVANCED .7 DNX12 (GAUZE/BANDAGES/DRESSINGS) ×2 IMPLANT
ELECT REM PT RETURN 9FT ADLT (ELECTROSURGICAL) ×3
ELECTRODE REM PT RTRN 9FT ADLT (ELECTROSURGICAL) ×2 IMPLANT
GLOVE BIO SURGEON STRL SZ7.5 (GLOVE) ×3 IMPLANT
GOWN STRL REUS W/ TWL LRG LVL3 (GOWN DISPOSABLE) ×4 IMPLANT
GOWN STRL REUS W/ TWL XL LVL3 (GOWN DISPOSABLE) ×2 IMPLANT
GOWN STRL REUS W/TWL LRG LVL3 (GOWN DISPOSABLE) ×2
GOWN STRL REUS W/TWL XL LVL3 (GOWN DISPOSABLE) ×1
GRASPER SUT TROCAR 14GX15 (MISCELLANEOUS) ×3 IMPLANT
KIT BASIN OR (CUSTOM PROCEDURE TRAY) ×3 IMPLANT
KIT TURNOVER KIT B (KITS) ×3 IMPLANT
NDL INSUFFLATION 14GA 120MM (NEEDLE) ×1 IMPLANT
NEEDLE INSUFFLATION 14GA 120MM (NEEDLE) ×3 IMPLANT
NS IRRIG 1000ML POUR BTL (IV SOLUTION) ×3 IMPLANT
PAD ARMBOARD 7.5X6 YLW CONV (MISCELLANEOUS) ×3 IMPLANT
POUCH LAPAROSCOPIC INSTRUMENT (MISCELLANEOUS) ×3 IMPLANT
POUCH RETRIEVAL ECOSAC 10 (ENDOMECHANICALS) ×1 IMPLANT
POUCH RETRIEVAL ECOSAC 10MM (ENDOMECHANICALS) ×1
SCISSORS LAP 5X35 DISP (ENDOMECHANICALS) ×3 IMPLANT
SET IRRIG TUBING LAPAROSCOPIC (IRRIGATION / IRRIGATOR) ×3 IMPLANT
SET TUBE SMOKE EVAC HIGH FLOW (TUBING) ×3 IMPLANT
SLEEVE ENDOPATH XCEL 5M (ENDOMECHANICALS) ×5 IMPLANT
SPECIMEN JAR SMALL (MISCELLANEOUS) ×3 IMPLANT
SUT MNCRL AB 4-0 PS2 18 (SUTURE) ×3 IMPLANT
TOWEL GREEN STERILE (TOWEL DISPOSABLE) ×3 IMPLANT
TRAY LAPAROSCOPIC MC (CUSTOM PROCEDURE TRAY) ×3 IMPLANT
TROCAR XCEL NON-BLD 11X100MML (ENDOMECHANICALS) ×3 IMPLANT
TROCAR XCEL NON-BLD 5MMX100MML (ENDOMECHANICALS) ×3 IMPLANT
WARMER LAPAROSCOPE (MISCELLANEOUS) ×3 IMPLANT
WATER STERILE IRR 1000ML POUR (IV SOLUTION) ×3 IMPLANT

## 2021-10-17 NOTE — Discharge Instructions (Signed)
[facility] ______[facility] LAPAROSCOPIC SURGERY: POST OP INSTRUCTIONS Always review your discharge instruction sheet given to you by the facility where your surgery was performed. IF YOU HAVE DISABILITY OR FAMILY LEAVE FORMS, YOU MUST BRING THEM TO THE OFFICE FOR PROCESSING.   DO NOT GIVE THEM TO YOUR DOCTOR.  A prescription for pain medication may be given to you upon discharge.  Take your pain medication as prescribed, if needed.  If narcotic pain medicine is not needed, then you may take acetaminophen (Tylenol) or ibuprofen (Advil) as needed. Take your usually prescribed medications unless otherwise directed. If you need a refill on your pain medication, please contact your pharmacy.  They will contact our office to request authorization. Prescriptions will not be filled after 5pm or on week-ends. You should follow a light diet the first few days after arrival home, such as soup and crackers, etc.  Be sure to include lots of fluids daily. Most patients will experience some swelling and bruising in the area of the incisions.  Ice packs will help.  Swelling and bruising can take several days to resolve.  It is common to experience some constipation if taking pain medication after surgery.  Increasing fluid intake and taking a stool softener (such as Colace) will usually help or prevent this problem from occurring.  A mild laxative (Milk of Magnesia or Miralax) should be taken according to package instructions if there are no bowel movements after 48 hours. Unless discharge instructions indicate otherwise, you may remove your bandages 24-48 hours after surgery, and you may shower at that time.  You may have steri-strips (small skin tapes) in place directly over the incision.  These strips should be left on the skin for 7-10 days.  If your surgeon used skin glue on the incision, you may shower in 24 hours.  The glue will flake off over the next 2-3 weeks.  Any sutures or staples will be  removed at the office during your follow-up visit. ACTIVITIES:  You may resume regular (light) daily activities beginning the next day--such as daily self-care, walking, climbing stairs--gradually increasing activities as tolerated.  You may have sexual intercourse when it is comfortable.  Refrain from any heavy lifting or straining until approved by your doctor. You may drive when you are no longer taking prescription pain medication, you can comfortably wear a seatbelt, and you can safely maneuver your car and apply brakes. RETURN TO WORK:  __________________________________________________________ You should see your doctor in the office for a follow-up appointment approximately 2-3 weeks after your surgery.  Make sure that you call for this appointment within a day or two after you arrive home to insure a convenient appointment time. OTHER INSTRUCTIONS: __________________________________________________________________________________________________________________________ __________________________________________________________________________________________________________________________ WHEN TO CALL YOUR DOCTOR: Fever over 101.0 Inability to urinate Continued bleeding from incision. Increased pain, redness, or drainage from the incision. Increasing abdominal pain  The clinic staff is available to answer your questions during regular business hours.  Please don't hesitate to call and ask to speak to one of the nurses for clinical concerns.  If you have a medical emergency, go to the nearest emergency room or call 911.  A surgeon from [facility] is always on call at the hospital. [address], [address], [place], [place]  [address] ? [address] [address], , [place]   [address] [phone] ? [phone] ? FAX [phone] Web site: [email or web address]  

## 2021-10-17 NOTE — Anesthesia Preprocedure Evaluation (Addendum)
Anesthesia Evaluation  Patient identified by MRN, date of birth, ID band Patient awake    Reviewed: Allergy & Precautions, NPO status , Patient's Chart, lab work & pertinent test results  Airway Mallampati: II  TM Distance: >3 FB Neck ROM: Full    Dental  (+) Teeth Intact, Dental Advisory Given   Pulmonary neg pulmonary ROS,    Pulmonary exam normal breath sounds clear to auscultation       Cardiovascular negative cardio ROS Normal cardiovascular exam Rhythm:Regular Rate:Normal     Neuro/Psych negative neurological ROS     GI/Hepatic negative GI ROS, Choledocholithiasis   Endo/Other  negative endocrine ROS  Renal/GU negative Renal ROS     Musculoskeletal negative musculoskeletal ROS (+)   Abdominal   Peds  Hematology  (+) Blood dyscrasia, anemia ,   Anesthesia Other Findings Day of surgery medications reviewed with the patient.  Reproductive/Obstetrics                             Anesthesia Physical Anesthesia Plan  ASA: 2  Anesthesia Plan: General   Post-op Pain Management: Tylenol PO (pre-op)*   Induction: Intravenous  PONV Risk Score and Plan: 3 and Midazolam, Dexamethasone and Ondansetron  Airway Management Planned: Oral ETT  Additional Equipment:   Intra-op Plan:   Post-operative Plan: Extubation in OR  Informed Consent: I have reviewed the patients History and Physical, chart, labs and discussed the procedure including the risks, benefits and alternatives for the proposed anesthesia with the patient or authorized representative who has indicated his/her understanding and acceptance.     Dental advisory given  Plan Discussed with: CRNA  Anesthesia Plan Comments:         Anesthesia Quick Evaluation

## 2021-10-17 NOTE — Transfer of Care (Signed)
Immediate Anesthesia Transfer of Care Note  Patient: Jimmy Rivera  Procedure(s) Performed: LAPAROSCOPIC CHOLECYSTECTOMY (Abdomen)  Patient Location: PACU  Anesthesia Type:General  Level of Consciousness: awake, alert  and oriented  Airway & Oxygen Therapy: Patient Spontanous Breathing  Post-op Assessment: Report given to RN and Post -op Vital signs reviewed and stable  Post vital signs: Reviewed and stable  Last Vitals:  Vitals Value Taken Time  BP 101/62 10/17/21 1400  Temp 36.6 C 10/17/21 1400  Pulse 79 10/17/21 1401  Resp 14 10/17/21 1401  SpO2 95 % 10/17/21 1401  Vitals shown include unvalidated device data.  Last Pain:  Vitals:   10/17/21 1200  TempSrc: Oral  PainSc:          Complications: No notable events documented.

## 2021-10-17 NOTE — Anesthesia Postprocedure Evaluation (Signed)
Anesthesia Post Note  Patient: Jimmy Rivera  Procedure(s) Performed: LAPAROSCOPIC CHOLECYSTECTOMY (Abdomen)     Patient location during evaluation: PACU Anesthesia Type: General Level of consciousness: awake and alert Pain management: pain level controlled Vital Signs Assessment: post-procedure vital signs reviewed and stable Respiratory status: spontaneous breathing, nonlabored ventilation, respiratory function stable and patient connected to nasal cannula oxygen Cardiovascular status: blood pressure returned to baseline and stable Postop Assessment: no apparent nausea or vomiting Anesthetic complications: no   No notable events documented.  Last Vitals:  Vitals:   10/17/21 1430 10/17/21 1521  BP: 102/60 98/67  Pulse: 66 82  Resp: 14 17  Temp: 36.4 C 37.1 C  SpO2: 95% 99%    Last Pain:  Vitals:   10/17/21 1430  TempSrc:   PainSc: 0-No pain                 Collene Schlichter

## 2021-10-17 NOTE — Interval H&P Note (Signed)
History and Physical Interval Note:  10/17/2021 12:35 PM  Jimmy Rivera  has presented today for surgery, with the diagnosis of Choledocholithiasis.  The various methods of treatment have been discussed with the patient and family. After consideration of risks, benefits and other options for treatment, the patient has consented to  Procedure(s): LAPAROSCOPIC CHOLECYSTECTOMY (N/A) INTRAOPERATIVE CHOLANGIOGRAM (N/A) as a surgical intervention.  The patient's history has been reviewed, patient examined, no change in status, stable for surgery.  I have reviewed the patient's chart and labs.  Questions were answered to the patient's satisfaction.     Axel Filler

## 2021-10-17 NOTE — Op Note (Signed)
10/17/2021  1:39 PM  PATIENT:  Jimmy Rivera  36 y.o. male  PRE-OPERATIVE DIAGNOSIS:  Choledocholithiasis  POST-OPERATIVE DIAGNOSIS:  Cholelithiasis, chronic cholecystitis  PROCEDURE:  Procedure(s): LAPAROSCOPIC CHOLECYSTECTOMY (N/A)  SURGEON:  Surgeon(s) and Role:    * Axel Filler, MD - Primary  ANESTHESIA:   local and general  EBL:  minimal   BLOOD ADMINISTERED:none  DRAINS: none   LOCAL MEDICATIONS USED:  BUPIVICAINE   SPECIMEN:  Source of Specimen:  gallbladder  DISPOSITION OF SPECIMEN:  PATHOLOGY  COUNTS:  YES  TOURNIQUET:  * No tourniquets in log *  DICTATION: .Dragon Dictation  The patient was taken to the operating and placed in the supine position with bilateral SCDs in place.  The patient was prepped and draped in the usual sterile fashion. A time out was called and all facts were verified. A pneumoperitoneum was obtained via A Veress needle technique to a pressure of 70mm of mercury.  A 60mm trochar was then placed in the right upper quadrant under visualization, and there were no injuries to any abdominal organs. A 11 mm port was then placed in the umbilical region after infiltrating with local anesthesia under direct visualization. A second and third epigastric port and right lower quadrant port placement under direct visualization, respectively.    The gallbladder was identified and retracted, the peritoneum was then sharply dissected from the gallbladder and this dissection was carried down to Calot's triangle. The cystic duct was identified and stripped away circumferentially and seen going into the gallbladder 360, the critical angle was obtained.  2 clips were placed proximally one distally and the cystic duct transected. The cystic artery was identified and 2 clips placed proximally and one distally and transected.  We then proceeded to remove the gallbladder off the hepatic fossa with Bovie cautery. A retrieval bag was then placed in the abdomen  and gallbladder placed in the bag. The hepatic fossa was then reexamined and hemostasis was achieved with Bovie cautery and was excellent at the end of the case.   The subhepatic fossa and perihepatic fossa was then irrigated until the effluent was clear.  The gallbladder and bag were removed from the abdominal cavity. The 11 mm trocar fascia was reapproximated with the Endo Close #1 Vicryl x2.  The pneumoperitoneum was evacuated and all trochars removed under direct visulalization.  The skin was then closed with 4-0 Monocryl and the skin dressed with Dermabond.    The patient was awaken from general anesthesia and taken to the recovery room in stable condition.    PLAN OF CARE: Admit for overnight observation  PATIENT DISPOSITION:  PACU - hemodynamically stable.   Delay start of Pharmacological VTE agent (>24hrs) due to surgical blood loss or risk of bleeding: not applicable

## 2021-10-17 NOTE — Progress Notes (Signed)
Report called to short stay. Nurse verified understanding of report and had no further questions. Patient made aware that transpiration is in route. All undergarments have been removed.

## 2021-10-17 NOTE — Anesthesia Procedure Notes (Signed)
Procedure Name: Intubation Date/Time: 10/17/2021 12:48 PM Performed by: Carolan Clines, CRNA Pre-anesthesia Checklist: Patient identified, Emergency Drugs available, Suction available and Patient being monitored Patient Re-evaluated:Patient Re-evaluated prior to induction Oxygen Delivery Method: Circle System Utilized Preoxygenation: Pre-oxygenation with 100% oxygen Induction Type: IV induction Ventilation: Mask ventilation without difficulty Laryngoscope Size: Mac and 4 Grade View: Grade I Tube type: Oral Tube size: 7.5 mm Number of attempts: 1 Airway Equipment and Method: Stylet Placement Confirmation: ETT inserted through vocal cords under direct vision, positive ETCO2 and breath sounds checked- equal and bilateral Secured at: 23 cm Tube secured with: Tape Dental Injury: Teeth and Oropharynx as per pre-operative assessment

## 2021-10-17 NOTE — Progress Notes (Signed)
Spanish Discharge instructions printed for patient and given to him before discharge.

## 2021-10-17 NOTE — Progress Notes (Signed)
Discharge instructions given to pt. Pt verbalized understanding of all teaching and had no further question. Patient discharged home with parents via wheelchair with all belongings.

## 2021-10-18 ENCOUNTER — Encounter (HOSPITAL_COMMUNITY): Payer: Self-pay | Admitting: General Surgery

## 2021-10-19 LAB — SURGICAL PATHOLOGY

## 2021-11-18 ENCOUNTER — Telehealth: Payer: Self-pay | Admitting: Family Medicine

## 2021-11-18 ENCOUNTER — Telehealth: Payer: Self-pay

## 2021-11-18 ENCOUNTER — Ambulatory Visit: Payer: No Typology Code available for payment source | Admitting: Family Medicine

## 2021-11-18 ENCOUNTER — Encounter: Payer: Self-pay | Admitting: Family Medicine

## 2021-11-18 VITALS — BP 101/75 | HR 72 | Temp 97.6°F | Ht 68.0 in | Wt 179.4 lb

## 2021-11-18 DIAGNOSIS — Z9889 Other specified postprocedural states: Secondary | ICD-10-CM

## 2021-11-18 DIAGNOSIS — B351 Tinea unguium: Secondary | ICD-10-CM

## 2021-11-18 DIAGNOSIS — N4601 Organic azoospermia: Secondary | ICD-10-CM

## 2021-11-18 DIAGNOSIS — R911 Solitary pulmonary nodule: Secondary | ICD-10-CM

## 2021-11-18 DIAGNOSIS — L662 Folliculitis decalvans: Secondary | ICD-10-CM

## 2021-11-18 DIAGNOSIS — Z9049 Acquired absence of other specified parts of digestive tract: Secondary | ICD-10-CM

## 2021-11-18 DIAGNOSIS — K760 Fatty (change of) liver, not elsewhere classified: Secondary | ICD-10-CM | POA: Insufficient documentation

## 2021-11-18 MED ORDER — SULFAMETHOXAZOLE-TRIMETHOPRIM 800-160 MG PO TABS: 1.0000 | ORAL_TABLET | Freq: Two times a day (BID) | ORAL | 2 refills | Status: AC

## 2021-11-18 MED ORDER — KETOCONAZOLE 2 % EX CREA: 1.0000 | TOPICAL_CREAM | Freq: Two times a day (BID) | CUTANEOUS | 0 refills | Status: AC

## 2021-11-18 MED ORDER — SULFAMETHOXAZOLE-TRIMETHOPRIM 800-160 MG PO TABS: 1.0000 | ORAL_TABLET | Freq: Two times a day (BID) | ORAL | 0 refills | Status: DC

## 2021-11-18 NOTE — Assessment & Plan Note (Signed)
Did follow-up with dermatology in [place], but needs to reestablish  Previously on Bactrim We will refill Bactrim for 1 month Referral to dermatology placed for further management

## 2021-11-18 NOTE — Patient Instructions (Addendum)
We are referring dermatology for your scalp lesion, I would restart bactrim.   Try ketoconazole for nail fungus

## 2021-11-18 NOTE — Assessment & Plan Note (Signed)
Did follow-up with urology in the past Appears work-up was negative at that time No complaints at this time from patient

## 2021-11-18 NOTE — Telephone Encounter (Signed)
Pt is needing his referral [phone] changed to another office, they do not take his [facility] discount.   Pt's # [phone]

## 2021-11-18 NOTE — Assessment & Plan Note (Signed)
Right fifth digit Trial topical ketoconazole Follow-up as needed

## 2021-11-18 NOTE — Progress Notes (Signed)
Jimmy Rivera is a 36 y.o. male who presents today for an office visit.  Assessment/Plan:   Problem List Items Addressed This Visit       Respiratory   Pulmonary nodule    Right upper lobe Incidentally found on chest x-ray and 2022 Asymptomatic Discussed possible differential including bone island, vascular abnormalities, and/or cancer Recommend CT chest Patient declined        Musculoskeletal and Integument   Onychomycosis - Primary    Right fifth digit Trial topical ketoconazole Follow-up as needed      Relevant Medications   ketoconazole (NIZORAL) 2 % cream   sulfamethoxazole-trimethoprim (BACTRIM DS) 800-160 MG tablet   Folliculitis decalvans    Did follow-up with dermatology in [place], but needs to reestablish  Previously on Bactrim We will refill Bactrim for 1 month Referral to dermatology placed for further management      Relevant Medications   sulfamethoxazole-trimethoprim (BACTRIM DS) 800-160 MG tablet   Other Relevant Orders   Ambulatory referral to Dermatology     Genitourinary   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 male who has Onychomycosis; Folliculitis decalvans; Pulmonary nodule; Hepatic steatosis; S/P cholecystectomy; S/P discectomy; and Azoospermia on their problem list..   He  has a past medical history of Choledocholithiasis (10/16/2021).Marland Kitchen   He presents with chief complaint of Establish Care (Place of concern on the back of scalp. Was taking bactrim for scalp by previous dermatologist, but didn't finish 3 month supply. Nail discoloration on right and left 5th toes. ) .   Patient here to establish care.  He is shortness area but recently moved back from [place] few months ago for work.  Patient reports yellowing  of left and right 5th toe nail.  Is not painful.  The left toes gotten better.  There is been no drainage from the area.  Patient has not tried anything for this.  Patient has a history of folliculitis on scalp for at least the past 6 months.  He was previously evaluated by dermatology who started him on several months of doxycycline.  He was then switched to Bactrim and instructed to take it for 3 months.  Patient took Bactrim for about 1-1/2 months.  Lesion did resolve.  Patient then discontinue the Bactrim and the lesion in reoccurred.  In the past that he has had drainage and been tender, but there is no drainage tenderness today.    Patient has history of cholecystectomy in 09/2021.  This was due to, due to galls stones with abdominal pain.  Patient reports that he is feeling well and has no recurrence of abdominal pain.  Preoperative labs CMP did show transient anemia in the 500s and mildly elevated bilirubin, postoperative CMP did show improvements in knees.  Preoperative CBC had no anemia, postoperative CBC showed mild anemia at 12.6, likely due to ABLA.   Patient has a history of lower disc disease, status post discectomy.  This was performed in 2022 for lumbar radiculopathy.  He reports that his back pain is improved.  In 2022, patient did follow with urology for his azoospermia.  Patient had work-up including hormonal markers including testosterone, FH, LH which were unremarkable.  He also  had genetic analysis karyotyping of sperm that was negative.  By reviewing patient's chart, I found a chest x-ray that mentioned incidental finding of a right upper lobe lung nodule.differential per radiology included vascular malformation or bone island.  Patient was unaware of this in his history.  Denies any chest pain or shortness of breath.  Declines CT        Objective:  Physical Exam: BP 101/75 (BP Location: Left Arm, Patient Position: Sitting, Cuff Size: Large)   Pulse 72   Temp 97.6 F (36.4  C) (Temporal)   Ht 5\' 8"  (1.727 m)   Wt 179 lb 6.4 oz (81.4 kg)   SpO2 98%   BMI 27.28 kg/m    General: No acute distress. Awake and conversant.  Eyes: Normal conjunctiva, anicteric. Round symmetric pupils.  ENT: Hearing grossly intact. No nasal discharge.  Neck: Neck is supple. No masses or thyromegaly.  Respiratory: Respirations are non-labored. No auditory wheezing.  Skin: Warm. No rashes or ulcers.  Mild thickening and yellowing of right fifth digit Psych: Alert and oriented. Cooperative, Appropriate mood and affect, Normal judgment.  CV: No cyanosis or JVD ABD: Soft, nontender, recently healed laparoscopic scars, without tenderness MSK: Normal ambulation. No clubbing  Neuro: Sensation and CN II-XII grossly normal.        , MD, MS

## 2021-11-18 NOTE — Assessment & Plan Note (Signed)
Right upper lobe Incidentally found on chest x-ray and 2022 Asymptomatic Discussed possible differential including bone island, vascular abnormalities, and/or cancer Recommend CT chest Patient declined

## 2021-11-18 NOTE — Telephone Encounter (Signed)
error 

## 2021-11-18 NOTE — Telephone Encounter (Signed)
Patient advised refill has been sent for 3 months

## 2021-11-19 NOTE — Discharge Summary (Signed)
[facility] Discharge Summary   Patient ID: Jimmy Rivera MRN: [id number] DOB/AGE: 34-04-1986 34 y.o.  Admit date: 10/16/2021 Discharge date: 10/17/2021  Admitting Diagnosis: Symptomatic cholelithiasis   Discharge Diagnosis Patient Active Problem List   Diagnosis Date Noted   Onychomycosis 11/18/2021   Folliculitis decalvans 11/18/2021   Pulmonary nodule 11/18/2021   Hepatic steatosis 11/18/2021   S/P cholecystectomy 11/18/2021   S/P discectomy 11/18/2021   Azoospermia 11/18/2021    Consultants None   Imaging: No results found.  Procedures Dr. Axel Filler (10/17/21)- Laparoscopic Cholecystectomy  HPI: 79M with epigastric and LUQ abdominal pain for the last three days. Associated nausea, vomiting, anorexia, and scleral icterus. Has not taken anything for the pain.  Known h/o gallstones dx 2y ago and reports 2-3 episodes per year. States this is not the most severe episode, but is the longest lasting episode. Last BM 1d ago.   Hospital Course:    Workup showed sxs cholelithiasis, possible passed choledocholithiasis. Bilirubin was 3.7 on admission and decreased to 1.0 the next day. Patient was admitted and underwent procedure listed above.  Tolerated procedure well and was transferred to the floor.  Diet was advanced as tolerated.  On POD#0, the patient was voiding well, tolerating diet, ambulating well, pain well controlled, vital signs stable, incisions c/d/i and felt stable for discharge home.  Patient will follow up in our office and knows to call with questions or concerns.    Physical Exam: General:  Alert, NAD, pleasant, comfortable Abd:  Soft, ND, mild tenderness, incisions C/D/I  Allergies as of 10/17/2021       Reactions   Lactose Intolerance (gi)    Other reaction(s): Other Bloating, stomach pain        Medication List     TAKE these medications    Biotin 1000 MCG tablet Take 1,000 mcg by mouth daily.   LUBRICATING EYE DROPS  OP Place 1 drop into both eyes daily as needed.   multivitamin with minerals Tabs tablet Take 1 tablet by mouth daily.   oxyCODONE-acetaminophen 5-325 MG tablet Commonly known as: Percocet Take 1 tablet by mouth every 4 (four) hours as needed for severe pain.          Follow-up Information     [facility], [place]. Schedule an appointment as soon as possible for a visit in 2 week(s).   Specialty: General Surgery Why: Post op visit Contact information: [address] [place] [address] [phone]        [facility]. Schedule an appointment as soon as possible for a visit.   Specialty: Internal Medicine Why: Call to scedule an appointment to establish for primary care Contact information: [address] [place] [address] [id number] mc [place] [address] [phone]                Signed: Hosie Spangle, [facility] 11/19/2021, 3:24 PM

## 2022-01-10 ENCOUNTER — Ambulatory Visit (INDEPENDENT_AMBULATORY_CARE_PROVIDER_SITE_OTHER): Payer: No Typology Code available for payment source | Admitting: Dermatology

## 2022-01-10 DIAGNOSIS — L219 Seborrheic dermatitis, unspecified: Secondary | ICD-10-CM

## 2022-01-10 DIAGNOSIS — L739 Follicular disorder, unspecified: Secondary | ICD-10-CM

## 2022-01-10 DIAGNOSIS — L905 Scar conditions and fibrosis of skin: Secondary | ICD-10-CM

## 2022-01-10 MED ORDER — CICLOPIROX 1 % EX SHAM: MEDICATED_SHAMPOO | CUTANEOUS | 2 refills | Status: AC

## 2022-01-10 MED ORDER — CLOBETASOL PROPIONATE 0.05 % EX SOLN: 1.0000 | Freq: Two times a day (BID) | CUTANEOUS | 2 refills | Status: AC

## 2022-01-10 MED ORDER — HYDROQUINONE 4 % EX CREA: TOPICAL_CREAM | Freq: Two times a day (BID) | CUTANEOUS | 2 refills | Status: AC

## 2022-01-10 MED ORDER — MINOCYCLINE HCL 100 MG PO CAPS: 100.0000 mg | ORAL_CAPSULE | Freq: Two times a day (BID) | ORAL | 2 refills | Status: AC

## 2022-01-10 NOTE — Progress Notes (Signed)
New Patient Visit  Subjective  Jimmy Rivera is a 36 y.o. male who presents for the following: folliculitis (NP here today for folliculitis at posterior neck. Patient took bactrim for 1 1/2 months, when he discontinued bacteria came back. Patient was prescribed bactrim again and has been taking it for 2 months. ), Nail Problem (Patient using ketoconazole cream to treat right fifth digit.), and Scar (Patient had surgery 3 and 6 months ago at abdomen and back and would like to know what he can do for the scarring. ).  Patient was getting bumps but currently with just redness. He has taken doxycycline in the past, used clobetasol solution to areas at scalp/hairline.   Patient will [place] moving out of state.  The following portions of the chart were reviewed this encounter and updated as appropriate:       Review of Systems:  No other skin or systemic complaints except as noted in HPI or Assessment and Plan.  Objective  Well appearing patient in no apparent distress; mood and affect are within normal limits.  A focused examination was performed including scalp, abdomen, back. Relevant physical exam findings are noted in the Assessment and Plan. R 5th toe and nail appears clear.  right occipital hairline Erythema right occipital hairline, no papules/pustules  Scalp Mild scaling at scalp  abdomen Hyperpigmented scars at abdomen/lower back from prior surgeries, a few with slight firmness    Assessment & Plan  Folliculitis right occipital hairline  Scalp clear today, D/c Bactrim  With flares/bumps, start minocycline 100 mg twice daily x 2 weeks then decrease to once daily for 2-3 months.    minocycline (MINOCIN) 100 MG capsule - right occipital hairline Take 1 capsule (100 mg total) by mouth 2 (two) times daily.  Seborrheic dermatitis Scalp  Chronic and persistent condition with duration or expected duration over one year. Condition is symptomatic/ bothersome to patient.  Not currently at goal.   Star ciclopirox shampoo at least three times per week, massage into scalp and leave in for a few minutes before rinsing out.  Start clobetasol solution to affected areas 1-2 times daily as needed for itch. Avoid applying to face, groin, and axilla. Use as directed. Long-term use can cause thinning of the skin.  Topical steroids (such as triamcinolone, fluocinolone, fluocinonide, mometasone, clobetasol, halobetasol, betamethasone, hydrocortisone) can cause thinning and lightening of the skin if they are used for too long in the same area. Your physician has selected the right strength medicine for your problem and area affected on the body. Please use your medication only as directed by your physician to prevent side effects.     Ciclopirox 1 % shampoo - Scalp at least three times per week, massage into scalp and leave in for a few minutes before rinsing out  clobetasol (TEMOVATE) 0.05 % external solution - Scalp Apply 1 Application topically 2 (two) times daily. To affected areas at scalp as needed for itch. Avoid applying to face, groin, and axilla. Use as directed. Long-term use can cause thinning of the skin.  Scar abdomen  Benign, observe.    Start hydroquinone 4% daily to affected areas of scars bid.  Sunscreen spf 30+ qd to sun-exposed areas  Recommend Serica moisturizing scar formula cream every night or [facility] brand or Mederma silicone scar sheet every night for the first year after a scar appears to help with scar remodeling if desired. Scars remodel on their own for a full year and will gradually improve in appearance  over time.   hydroquinone 4 % cream - abdomen Apply topically 2 (two) times daily.   Return if symptoms worsen or fail to improve.  Anise Salvo, RMA, am acting as scribe for Willeen Niece, MD .  Documentation: I have reviewed the above documentation for accuracy and completeness, and I agree with the above.  Willeen Niece MD

## 2022-01-10 NOTE — Patient Instructions (Addendum)
D/c Bactrim  With flares/bumps, start minocycline 100 mg twice daily x 2 weeks then decrease to once daily for 2-3 months.   Start hydroquinone 4% daily to affected areas of scars.  Recommend Serica moisturizing scar formula cream every night or [facility] brand or Mederma silicone scar sheet every night for the first year after a scar appears to help with scar remodeling if desired. Scars remodel on their own for a full year and will gradually improve in appearance over time.  Star ciclopirox shampoo at least three times per week, massage into scalp and leave in for a few minutes before rinsing out.  Start clobetasol solution to affected areas 1-2 times daily as needed for itch. Avoid applying to face, groin, and axilla. Use as directed. Long-term use can cause thinning of the skin.  Topical steroids (such as triamcinolone, fluocinolone, fluocinonide, mometasone, clobetasol, halobetasol, betamethasone, hydrocortisone) can cause thinning and lightening of the skin if they are used for too long in the same area. Your physician has selected the right strength medicine for your problem and area affected on the body. Please use your medication only as directed by your physician to prevent side effects.

## 2022-01-19 ENCOUNTER — Ambulatory Visit: Payer: No Typology Code available for payment source | Admitting: Dermatology

## 2022-09-11 IMAGING — US US ABDOMEN LIMITED
1 series · 14 of 25 positions shown · non-contrast
Comparison: None Available.

CLINICAL DATA: Right upper quadrant abdominal pain x2 days

EXAM:
ULTRASOUND ABDOMEN LIMITED RIGHT UPPER QUADRANT

[Series 1: us abdomen limited ruq (liver/gb) · 14 of 53 slices shown]
[im 1/53]
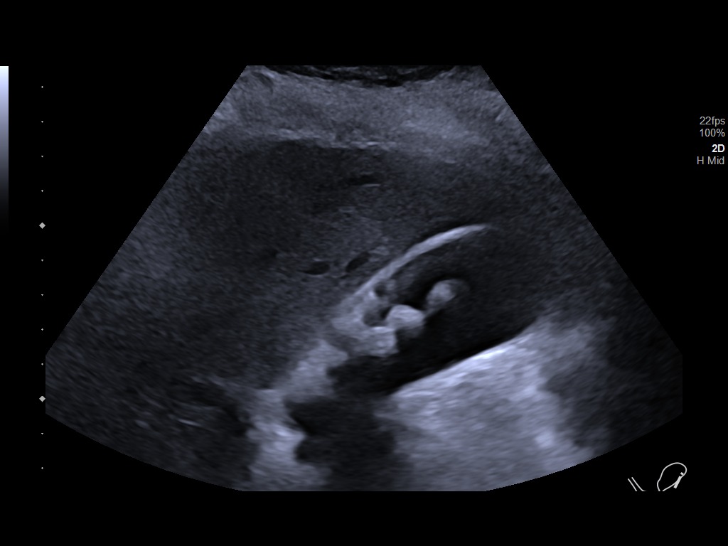
[im 5/53]
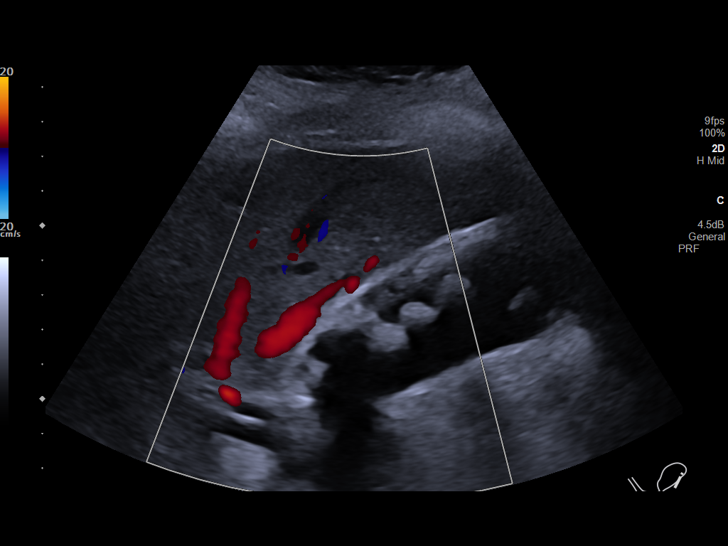
[im 9/53]
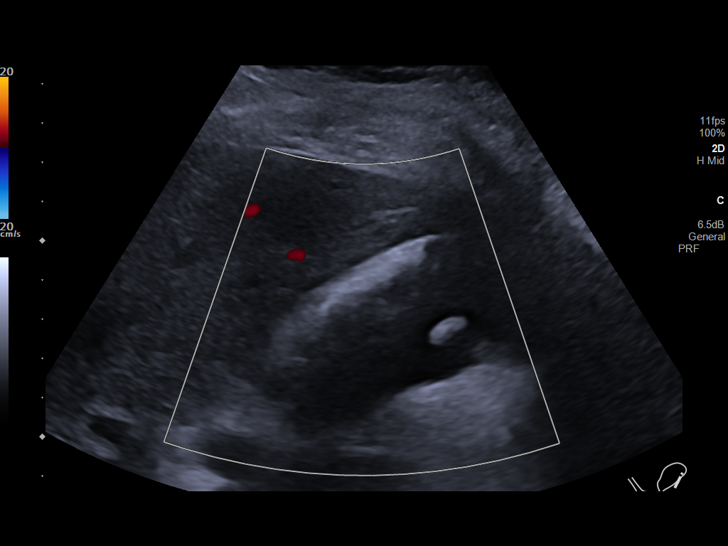
[im 14/53]
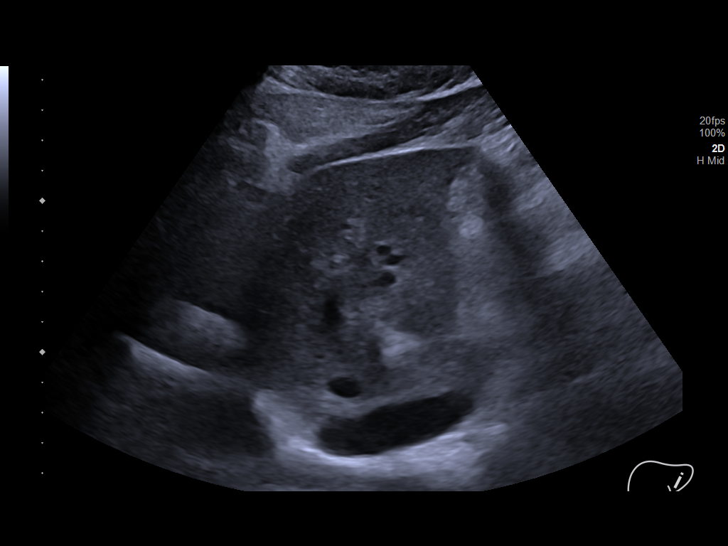
[im 18/53]
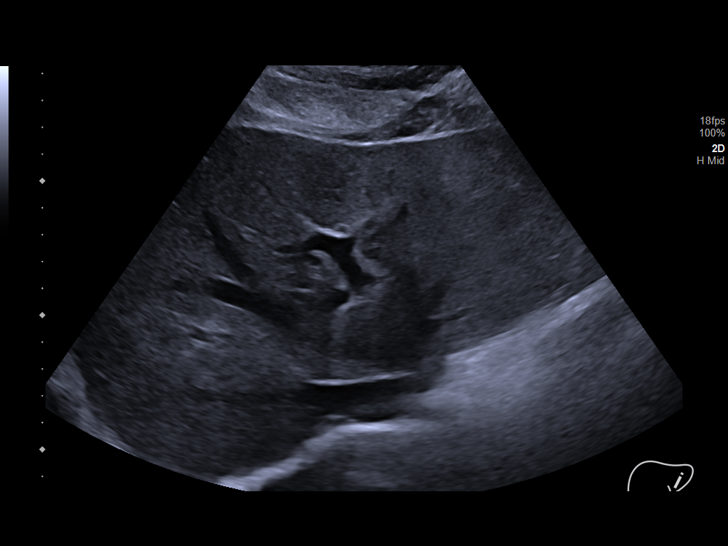
[im 20/53]
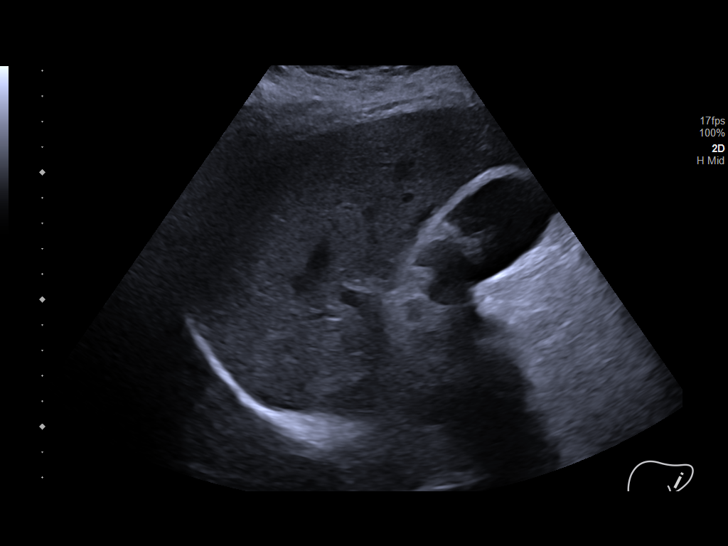
[im 24/53]
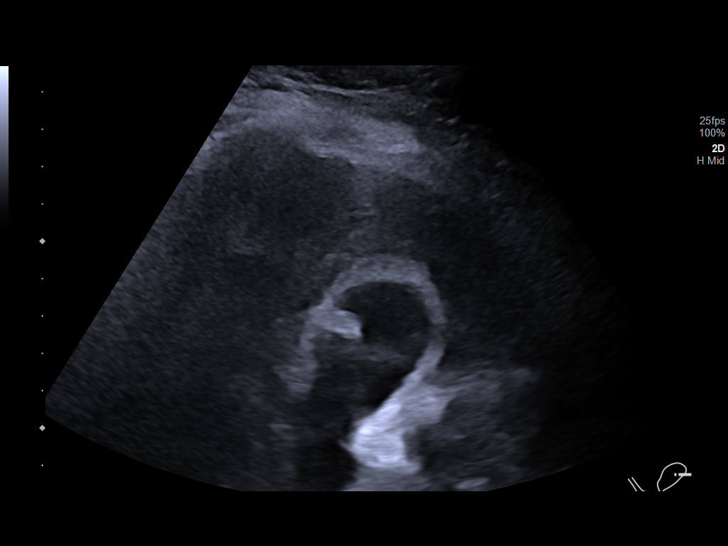
[im 29/53]
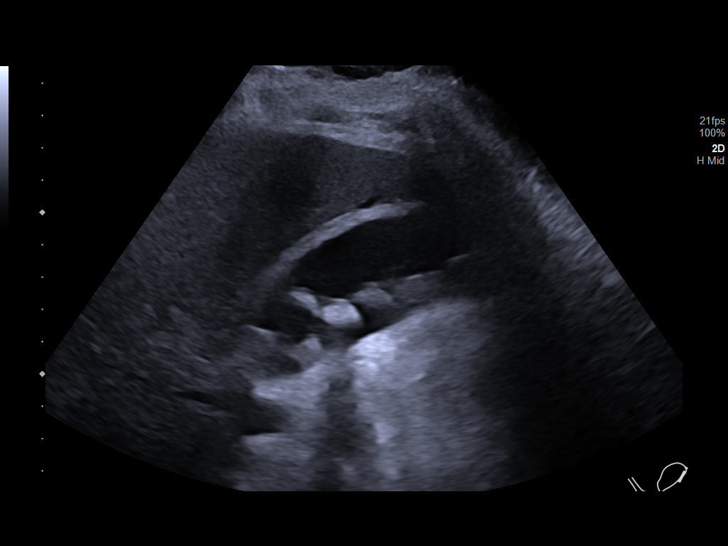
[im 33/53]
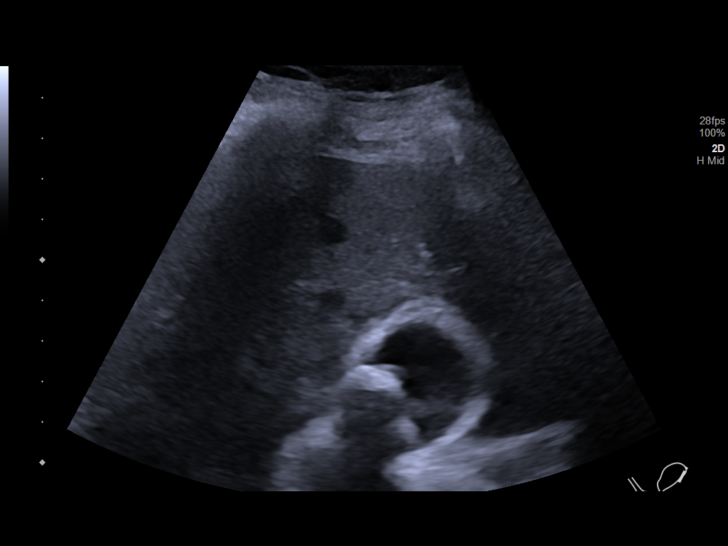
[im 35/53]
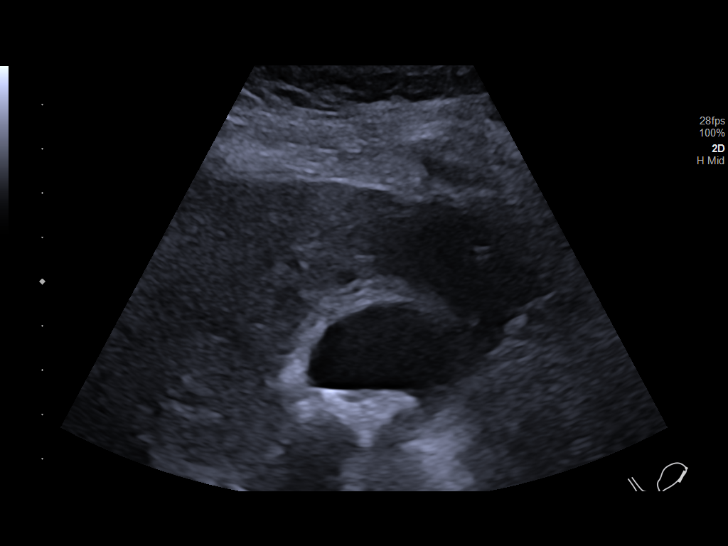
[im 40/53]
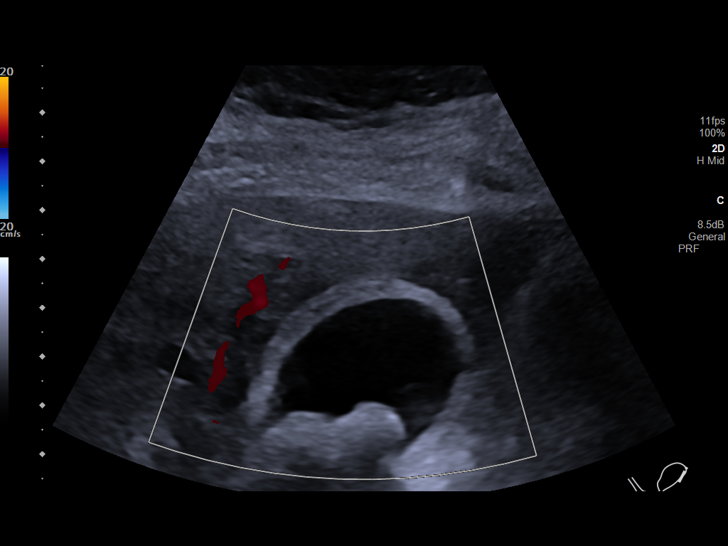
[im 44/53]
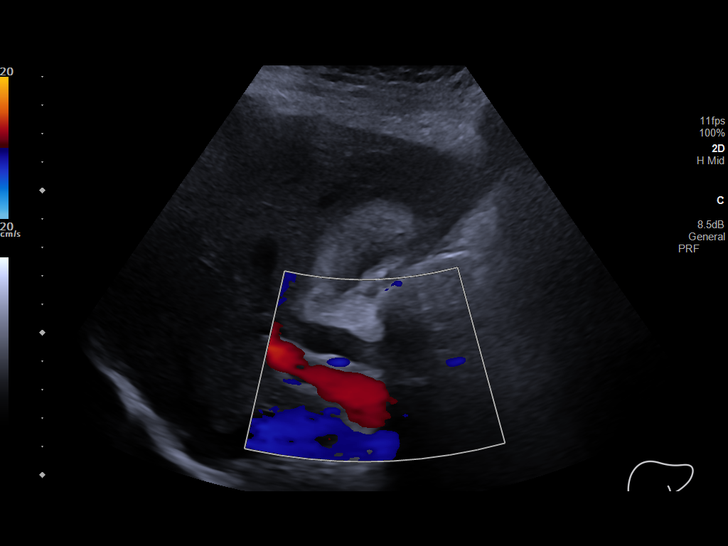
[im 48/53]
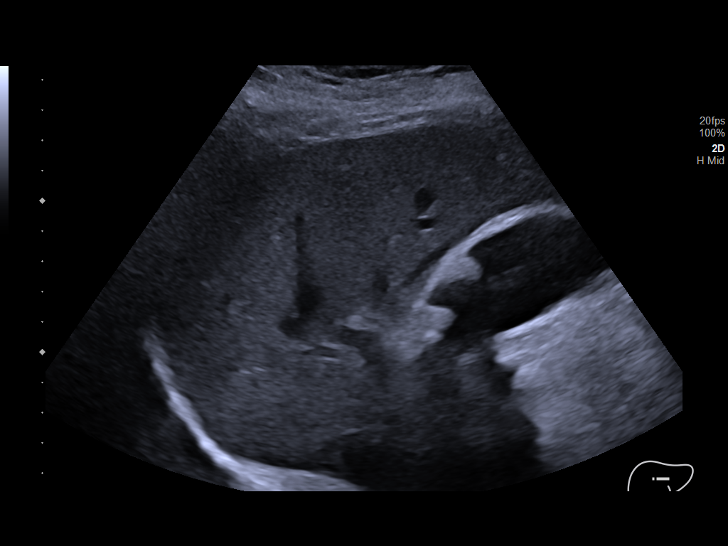
[im 53/53]
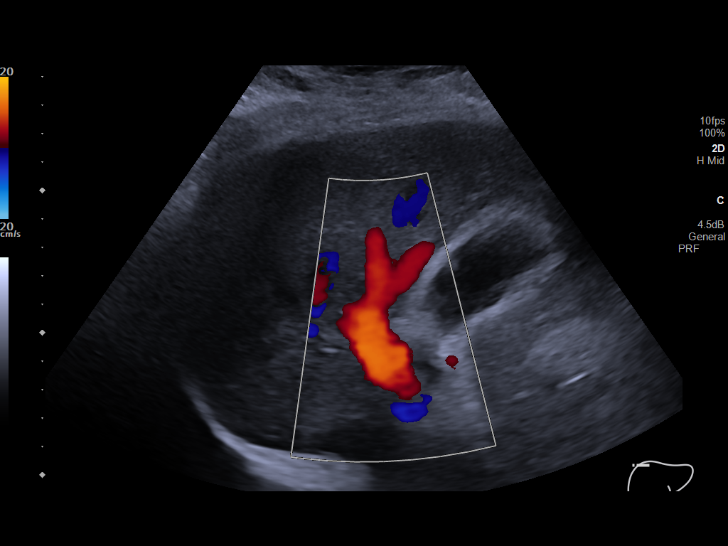

[14 of 25 positions shown; findings below may reference images not displayed]

FINDINGS: Gallbladder:

Multiple gallbladder stones are seen. There is wall thickening in
gallbladder measuring 6 mm. Trace amount of fluid is noted adjacent
to the gallbladder. Technologist did not observe any tenderness over
the gallbladder during the study.

Common bile duct:

Diameter: 7.7 mm.  Distal common bile duct is obscured by bowel gas.

Liver:

No focal lesion identified. Within normal limits in parenchymal
echogenicity. Portal vein is patent on color Doppler imaging with
normal direction of blood flow towards the liver.

Other: None.
IMPRESSION: Gallbladder stones. Gallbladder is not distended. There is wall
thickening in gallbladder along with trace amount of pericholecystic
fluid. Findings may suggest acute or chronic cholecystitis. Please
correlate with clinical symptoms and laboratory findings.
Technologist did not observe any focal tenderness over the
gallbladder during the study.

There is prominence of proximal common bile duct measuring 7.7 mm
without intrahepatic bile duct dilation.

## 2023-04-05 ENCOUNTER — Telehealth: Payer: Self-pay | Admitting: Family Medicine

## 2023-04-05 NOTE — Telephone Encounter (Signed)
LVM notifying pt to give [place] a call back at the office to sch a follow up appt as he is past due for one.
